# Patient Record
Sex: Female | Born: 2000 | Race: White | Hispanic: No | Marital: Single | Smoking: Never smoker
Health system: Southern US, Community
[De-identification: ages and names within clinical notes are randomized; demographics above are authoritative.]

## PROBLEM LIST (undated history)

## (undated) DIAGNOSIS — F319 Bipolar disorder, unspecified: Secondary | ICD-10-CM

## (undated) DIAGNOSIS — F410 Panic disorder [episodic paroxysmal anxiety] without agoraphobia: Secondary | ICD-10-CM

## (undated) DIAGNOSIS — F5000 Anorexia nervosa, unspecified: Secondary | ICD-10-CM

## (undated) DIAGNOSIS — K3184 Gastroparesis: Secondary | ICD-10-CM

---

## 2019-01-23 ENCOUNTER — Emergency Department (HOSPITAL_COMMUNITY)
Admission: EM | Admit: 2019-01-23 | Discharge: 2019-01-23 | Disposition: A | Discharge status: Home / Self Care | Payer: BLUE CROSS/BLUE SHIELD | Attending: Emergency Medicine | Admitting: Emergency Medicine

## 2019-01-23 ENCOUNTER — Encounter (HOSPITAL_COMMUNITY): Payer: Self-pay

## 2019-01-23 ENCOUNTER — Other Ambulatory Visit: Payer: Self-pay

## 2019-01-23 DIAGNOSIS — T7621XA Adult sexual abuse, suspected, initial encounter: Secondary | ICD-10-CM | POA: Insufficient documentation

## 2019-01-23 LAB — POC URINE PREG, ED: PREG TEST UR: NEGATIVE
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
## 2019-01-23 NOTE — Discharge Instructions (Addendum)
You may return to the ED tomorrow to have kit performed do know this will take about 4-6 hours. Please return to the ED sooner if other symptoms change.
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
## 2019-01-23 NOTE — ED Notes (Signed)
Made note on chart by accident, erased note.
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
## 2019-01-24 ENCOUNTER — Emergency Department (HOSPITAL_COMMUNITY)
Admission: EM | Admit: 2019-01-24 | Discharge: 2019-01-24 | Disposition: A | Discharge status: Home / Self Care | Payer: BLUE CROSS/BLUE SHIELD | Attending: Emergency Medicine | Admitting: Emergency Medicine

## 2019-01-24 ENCOUNTER — Other Ambulatory Visit: Payer: Self-pay

## 2019-01-24 ENCOUNTER — Encounter (HOSPITAL_COMMUNITY): Payer: Self-pay | Admitting: *Deleted

## 2019-01-24 DIAGNOSIS — Z0441 Encounter for examination and observation following alleged adult rape: Secondary | ICD-10-CM | POA: Diagnosis not present

## 2019-01-24 DIAGNOSIS — Z5321 Procedure and treatment not carried out due to patient leaving prior to being seen by health care provider: Secondary | ICD-10-CM | POA: Insufficient documentation

## 2019-01-24 HISTORY — DX: Bipolar disorder, unspecified: F31.9

## 2019-01-24 HISTORY — DX: Anorexia nervosa, unspecified: F50.00

## 2019-01-24 HISTORY — DX: Gastroparesis: K31.84

## 2019-01-24 HISTORY — DX: Panic disorder (episodic paroxysmal anxiety): F41.0

## 2019-01-24 NOTE — ED Notes (Signed)
Bed: WA23 Expected date:  Expected time:  Means of arrival:  Comments: 
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
## 2019-01-24 NOTE — ED Notes (Signed)
Pt not in [facility]

## 2020-03-24 ENCOUNTER — Emergency Department (HOSPITAL_COMMUNITY)
Admission: EM | Admit: 2020-03-24 | Discharge: 2020-03-25 | Disposition: A | Discharge status: Home / Self Care | Payer: BC Managed Care – PPO | Attending: Emergency Medicine | Admitting: Emergency Medicine

## 2020-03-24 ENCOUNTER — Encounter (HOSPITAL_COMMUNITY): Payer: Self-pay

## 2020-03-24 ENCOUNTER — Emergency Department (HOSPITAL_COMMUNITY): Payer: BC Managed Care – PPO

## 2020-03-24 ENCOUNTER — Other Ambulatory Visit: Payer: Self-pay

## 2020-03-24 DIAGNOSIS — R0789 Other chest pain: Secondary | ICD-10-CM | POA: Diagnosis not present

## 2020-03-24 DIAGNOSIS — F502 Bulimia nervosa: Secondary | ICD-10-CM | POA: Diagnosis not present

## 2020-03-24 DIAGNOSIS — R079 Chest pain, unspecified: Secondary | ICD-10-CM | POA: Diagnosis present

## 2020-03-24 LAB — CBC
HCT: 40.9 % (ref 36.0–46.0)
Hemoglobin: 13.1 g/dL (ref 12.0–15.0)
MCH: 27.7 pg (ref 26.0–34.0)
MCHC: 32 g/dL (ref 30.0–36.0)
MCV: 86.5 fL (ref 80.0–100.0)
Platelets: 347 10*3/uL (ref 150–400)
RBC: 4.73 MIL/uL (ref 3.87–5.11)
RDW: 12 % (ref 11.5–15.5)
WBC: 8.4 10*3/uL (ref 4.0–10.5)
nRBC: 0 % (ref 0.0–0.2)

## 2020-03-24 LAB — BASIC METABOLIC PANEL
Anion gap: 7 (ref 5–15)
BUN: 8 mg/dL (ref 6–20)
CO2: 27 mmol/L (ref 22–32)
Calcium: 9.4 mg/dL (ref 8.9–10.3)
Chloride: 104 mmol/L (ref 98–111)
Creatinine, Ser: 0.65 mg/dL (ref 0.44–1.00)
GFR calc Af Amer: >60 mL/min (ref >60)
GFR calc non Af Amer: >60 mL/min (ref >60)
Glucose, Bld: 95 mg/dL (ref 70–99)
Potassium: 3.8 mmol/L (ref 3.5–5.1)
Sodium: 138 mmol/L (ref 135–145)

## 2020-03-24 LAB — TROPONIN I (HIGH SENSITIVITY): Troponin I (High Sensitivity): <2 ng/L (ref <18)

## 2020-03-24 LAB — I-STAT BETA HCG BLOOD, ED (NOT ORDERABLE): I-stat hCG, quantitative: <5 m[IU]/mL (ref <5)

## 2020-03-24 MED ORDER — SODIUM CHLORIDE 0.9% FLUSH
3.0000 mL | Freq: Once | INTRAVENOUS | Status: AC
  Administered 2020-03-25: 01:00:00 3 mL via INTRAVENOUS

## 2020-03-24 NOTE — ED Triage Notes (Signed)
Pt sent from Urgent Care for further evaluation of non specific chest pain Pt states that she's had chest pain for a few weeks, intermittently, and she describes it as pressure Pt states that she's always nauseated because she has gastroparesis,  Pt denies any other symptoms

## 2020-03-25 MED ORDER — LIDOCAINE VISCOUS HCL 2 % MT SOLN: 15.0000 mL | Freq: Four times a day (QID) | OROMUCOSAL | 0 refills | Status: AC | PRN

## 2020-03-25 MED ORDER — SODIUM CHLORIDE 0.9 % IV BOLUS (SEPSIS)
1000.0000 mL | Freq: Once | INTRAVENOUS | Status: AC
  Administered 2020-03-25: 1000 mL via INTRAVENOUS

## 2020-03-25 MED ORDER — LIDOCAINE VISCOUS HCL 2 % MT SOLN
15.0000 mL | Freq: Once | OROMUCOSAL | Status: AC
  Administered 2020-03-25: 15 mL via ORAL
  Filled 2020-03-25: qty 15

## 2020-03-25 MED ORDER — PANTOPRAZOLE SODIUM 40 MG IV SOLR
40.0000 mg | Freq: Once | INTRAVENOUS | Status: AC
  Administered 2020-03-25: 40 mg via INTRAVENOUS
  Filled 2020-03-25: qty 40

## 2020-03-25 MED ORDER — ALUM & MAG HYDROXIDE-SIMETH 200-200-20 MG/5ML PO SUSP
30.0000 mL | Freq: Once | ORAL | Status: AC
  Administered 2020-03-25: 30 mL via ORAL
  Filled 2020-03-25: qty 30

## 2020-03-25 NOTE — ED Provider Notes (Signed)
TIME SEEN: 12:12 AM  CHIEF COMPLAINT: Chest pain, hematemesis, sent from urgent care  HPI: Patient is a 19 year old female with history of bipolar disorder, bulimia, gastroparesis followed by gastroenterology in [facility] who presents to the emergency department with 3 weeks of chest pain worse after vomiting.  Today she noticed small amounts of blood in her vomit and was seen at urgent care.  Urgent care sent her here for further evaluation.  She denies any abdominal pain, bloody stools or melena.  She is not on blood thinners.  She reports she does force vomiting several times a day.  She is on Dexilant.  No history of alcohol abuse or heavy NSAID use.  No history of PE, DVT, recent fractures, surgery, trauma, hospitalization or prolonged travel. No lower extremity swelling or pain. No calf tenderness.  She does have Nexplanon in place.  She is not a smoker.  ROS: See HPI Constitutional: no fever  Eyes: no drainage  ENT: no runny nose   Cardiovascular:   chest pain  Resp: no SOB  GI:  vomiting GU: no dysuria Integumentary: no rash  Allergy: no hives  Musculoskeletal: no leg swelling  Neurological: no slurred speech ROS otherwise negative  PAST MEDICAL HISTORY/PAST SURGICAL HISTORY:  Past Medical History:  Diagnosis Date  . Anorexia nervosa   . Bipolar disorder ([place])   . Gastroparesis   . Panic disorder     MEDICATIONS:  Prior to Admission medications   Not on File    ALLERGIES:  No Known Allergies  SOCIAL HISTORY:  Social History   Tobacco Use  . Smoking status: Never Smoker  . Smokeless tobacco: Never Used  Substance Use Topics  . Alcohol use: Yes    FAMILY HISTORY: History reviewed. No pertinent family history.  EXAM: BP 124/77 (BP Location: Left Arm)   Pulse (!) 107   Temp 98.1 F (36.7 C) (Oral)   Resp 18   Ht 5\' 2"  (1.575 m)   Wt 56.7 kg   SpO2 99%   BMI 22.86 kg/m  CONSTITUTIONAL: Alert and oriented and responds appropriately to  questions. Well-appearing; well-nourished, afebrile, nontoxic. HEAD: Normocephalic EYES: Conjunctivae clear, pupils appear equal, EOM appear intact ENT: normal nose; moist mucous membranes, no dental decay appreciated, normal phonation, no stridor, no trismus or drooling, handling secretions without difficulty NECK: Supple, normal ROM CARD: Regular and mildly tachycardic; S1 and S2 appreciated; no murmurs, no clicks, no rubs, no gallops RESP: Normal chest excursion without splinting or tachypnea; breath sounds clear and equal bilaterally; no wheezes, no rhonchi, no rales, no hypoxia or respiratory distress, speaking full sentences ABD/GI: Normal bowel sounds; non-distended; soft, non-tender, no rebound, no guarding, no peritoneal signs, no hepatosplenomegaly BACK:  The back appears normal EXT: Normal ROM in all joints; no deformity noted, no edema; no cyanosis, no calf tenderness or calf swelling SKIN: Normal color for age and race; warm; no rash on exposed skin NEURO: Moves all extremities equally PSYCH: The patient's mood and manner are appropriate.  Denies SI, HI, hallucinations.  Good eye contact.  MEDICAL DECISION MAKING: Patient here with atypical chest pain likely secondary to force vomiting from her bulimia.  She is very well-appearing here, nontoxic, handling her secretions.  I do not think that she has an esophageal tear or rupture at this time.  I do not think that she has esophageal varices.  I suspect that this is a Mallory-Weiss tear from frequent vomiting.  Her abdominal exam here is benign.  Plan  is to treat with Protonix, IV fluids and GI cocktail.  I do not feel she needs emergent imaging at this time.  I have reviewed her labs and interpreted them.  She has normal hemoglobin, normal creatinine and normal BUN.  Low suspicion for acute upper GI bleed.  She has had one normal high-sensitivity troponin.  Have very low suspicion this is ACS and I do not feel she needs a repeat.  Pregnancy  test is negative.  I have reviewed/interpreted her EKG.  Does show mild sinus tachycardia but no ischemia or arrhythmia.  She is on birth control but no other risk factors for PE.  Low suspicion this is a PE or dissection today.  Chest x-ray reviewed/interpreted and shows no acute abnormality.  No widened mediastinum, cardiomegaly, pneumothorax, infiltrate or edema.  ED PROGRESS: On reevaluation, patient reports feeling much better.  She has been able to drink without further vomiting.  I feel she is safe for discharge home.  She denies SI, HI or hallucinations.  I do not feel she needs emergent psychiatric evaluation.  She has an outpatient therapist for close follow-up as well as GI follow-up.  Have encouraged her to call in the morning for appointments.  Have recommended she continue her Dexilant.  Recommended over-the-counter Maalox or Mylanta for symptom relief and will discharge with viscous lidocaine as well.  Discussed at length return precautions.  Patient verbalized understanding.  She is aware that her symptoms are secondary to forceful vomiting from her bulimia.   At this time, I do not feel there is any life-threatening condition present. I have reviewed, interpreted and discussed all results (EKG, imaging, lab, urine as appropriate) and exam findings with patient/family. I have reviewed nursing notes and appropriate previous records.  I feel the patient is safe to be discharged home without further emergent workup and can continue workup as an outpatient as needed. Discussed usual and customary return precautions. Patient/family verbalize understanding and are comfortable with this plan.  Outpatient follow-up has been provided as needed. All questions have been answered.    EKG Interpretation  Date/Time:  Wednesday March 24 2020 20:30:17 EDT Ventricular Rate:  106 PR Interval:    QRS Duration: 99 QT Interval:  342 QTC Calculation: 455 R Axis:   86 Text Interpretation: Sinus tachycardia  RSR' in V1 or V2, right VCD or RVH 12 Lead; Mason-Likar No old tracing to compare Confirmed by Akeya Ryther, Baxter Hire [phone]) on 03/24/2020 11:58:53 PM          Melanie Morton was evaluated in Emergency Department on 03/25/2020 for the symptoms described in the history of present illness. She was evaluated in the context of the global COVID-19 pandemic, which necessitated consideration that the patient might be at risk for infection with the SARS-CoV-2 virus that causes COVID-19. Institutional protocols and algorithms that pertain to the evaluation of patients at risk for COVID-19 are in a state of rapid change based on information released by regulatory bodies including the CDC and federal and state organizations. These policies and algorithms were followed during the patient's care in the ED.      Kaseem Vastine, Layla Maw, DO 03/25/20 [phone]

## 2020-03-25 NOTE — ED Notes (Signed)
ED Provider at bedside. 

## 2020-03-25 NOTE — Discharge Instructions (Addendum)
Your labs today were reassuring.  I recommend close follow-up with your therapist as well as your gastroenterologist in [place].  Please continue your Dexilant as prescribed.  You may use over-the-counter Mylanta or Maalox to help with symptoms.  We are also sending you with lidocaine which can help coat your esophagus and stomach and help with your chest pain.  If you begin vomiting blood again and it is more than just streaks of blood, you have black or tarry stools, bright red blood in your stool, significant abdominal pain or worsening chest pain, unable to swallow, please return to the emergency department.  You may take over-the-counter Tylenol 1000 mg every 6 hours as needed for pain.  I would avoid NSAIDs such as ibuprofen, aspirin, Aleve, Motrin, naproxen, Goody powders at this time.

## 2020-03-25 NOTE — ED Notes (Addendum)
Verbalized understanding discharge instructions, prescriptions, and follow-up. In no acute distress.   

## 2020-03-25 NOTE — ED Notes (Signed)
Pt tolerated Ginger Ale well and ambulated to restroom.  NAD noted.

## 2020-03-25 NOTE — ED Notes (Signed)
Pt given Ginger Ale.  

## 2021-05-11 IMAGING — CR DG CHEST 2V
2 series · 2 of 2 positions shown · non-contrast
Comparison: None.

CLINICAL DATA: Nonspecific intermittent chest pain for several
weeks, nausea

EXAM:
CHEST - 2 VIEW

[w chest pa]
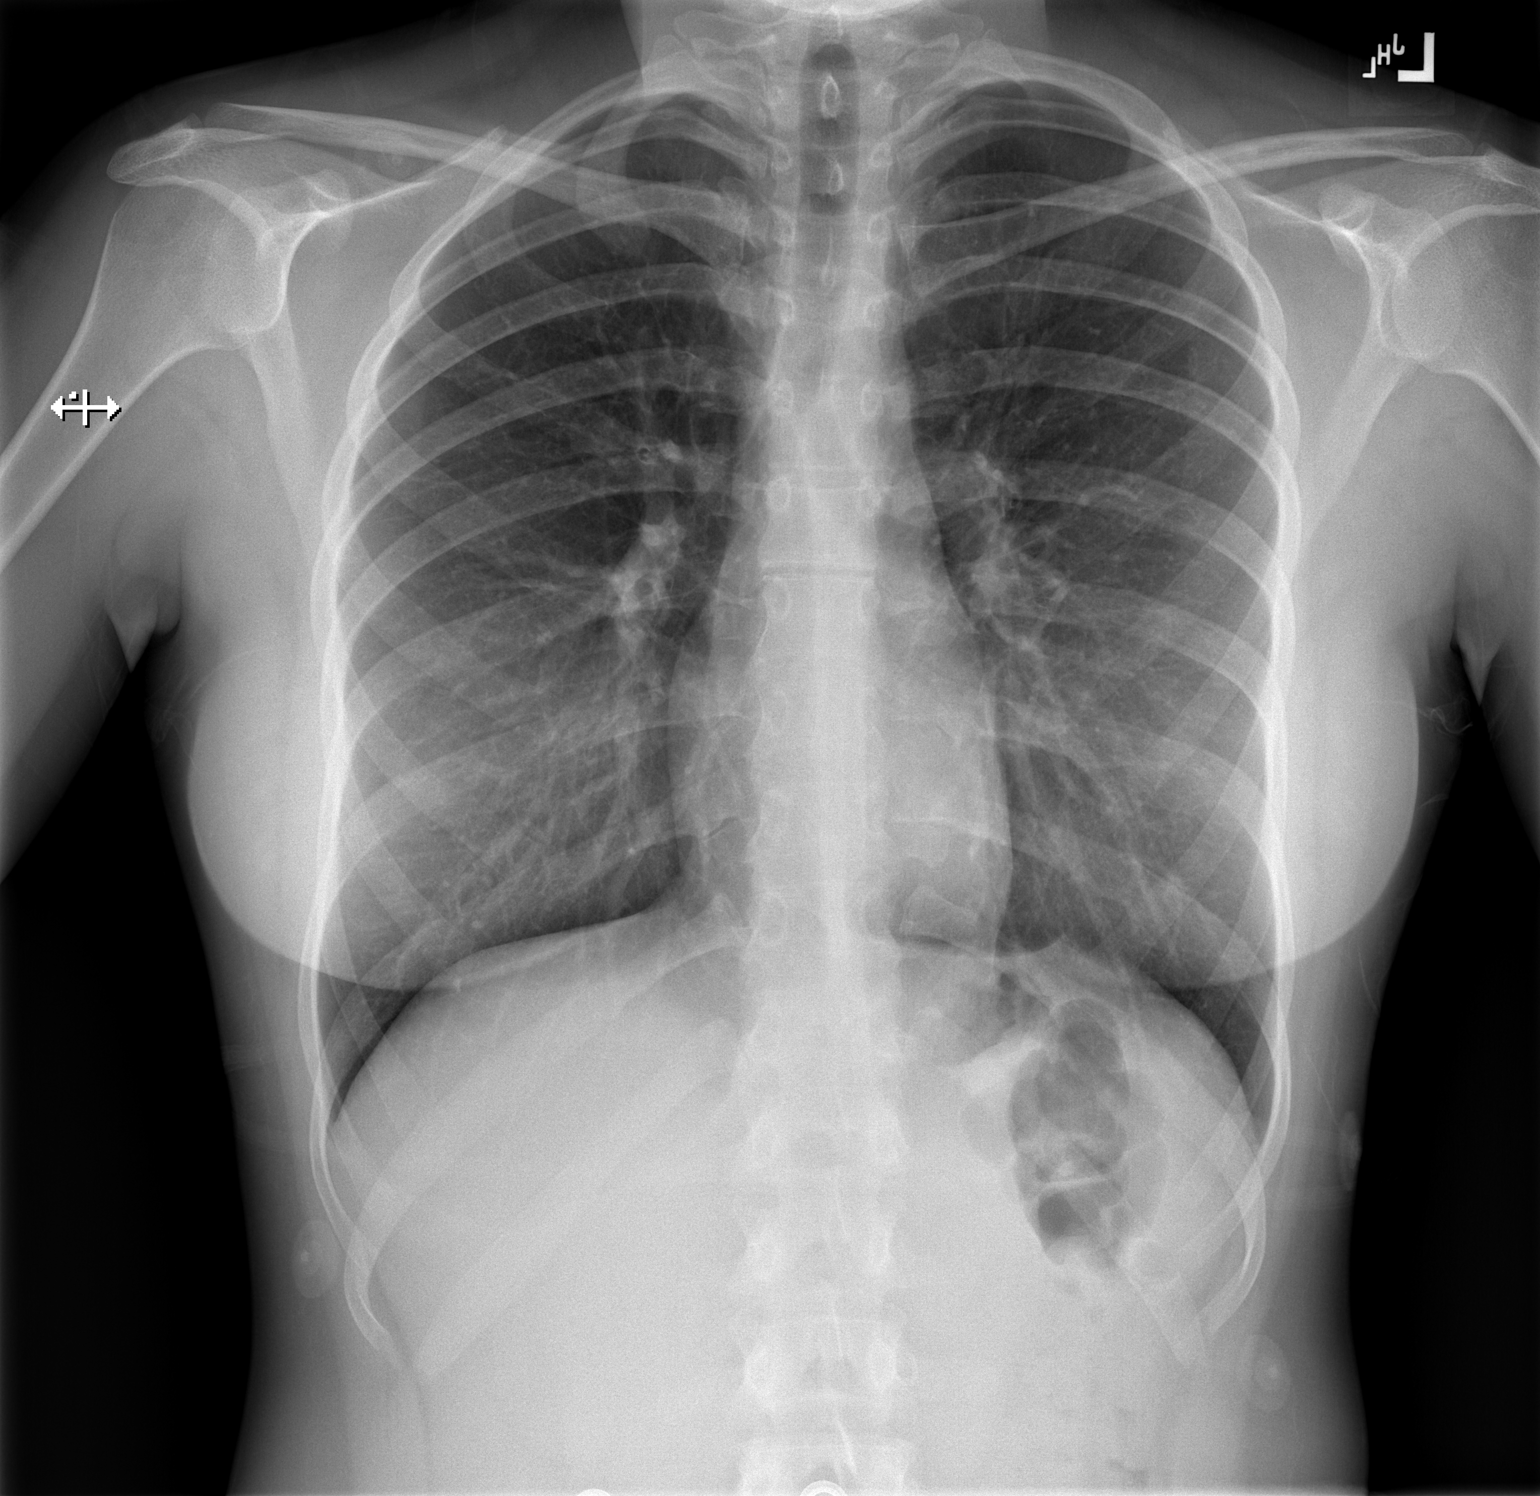

[w chest lat]
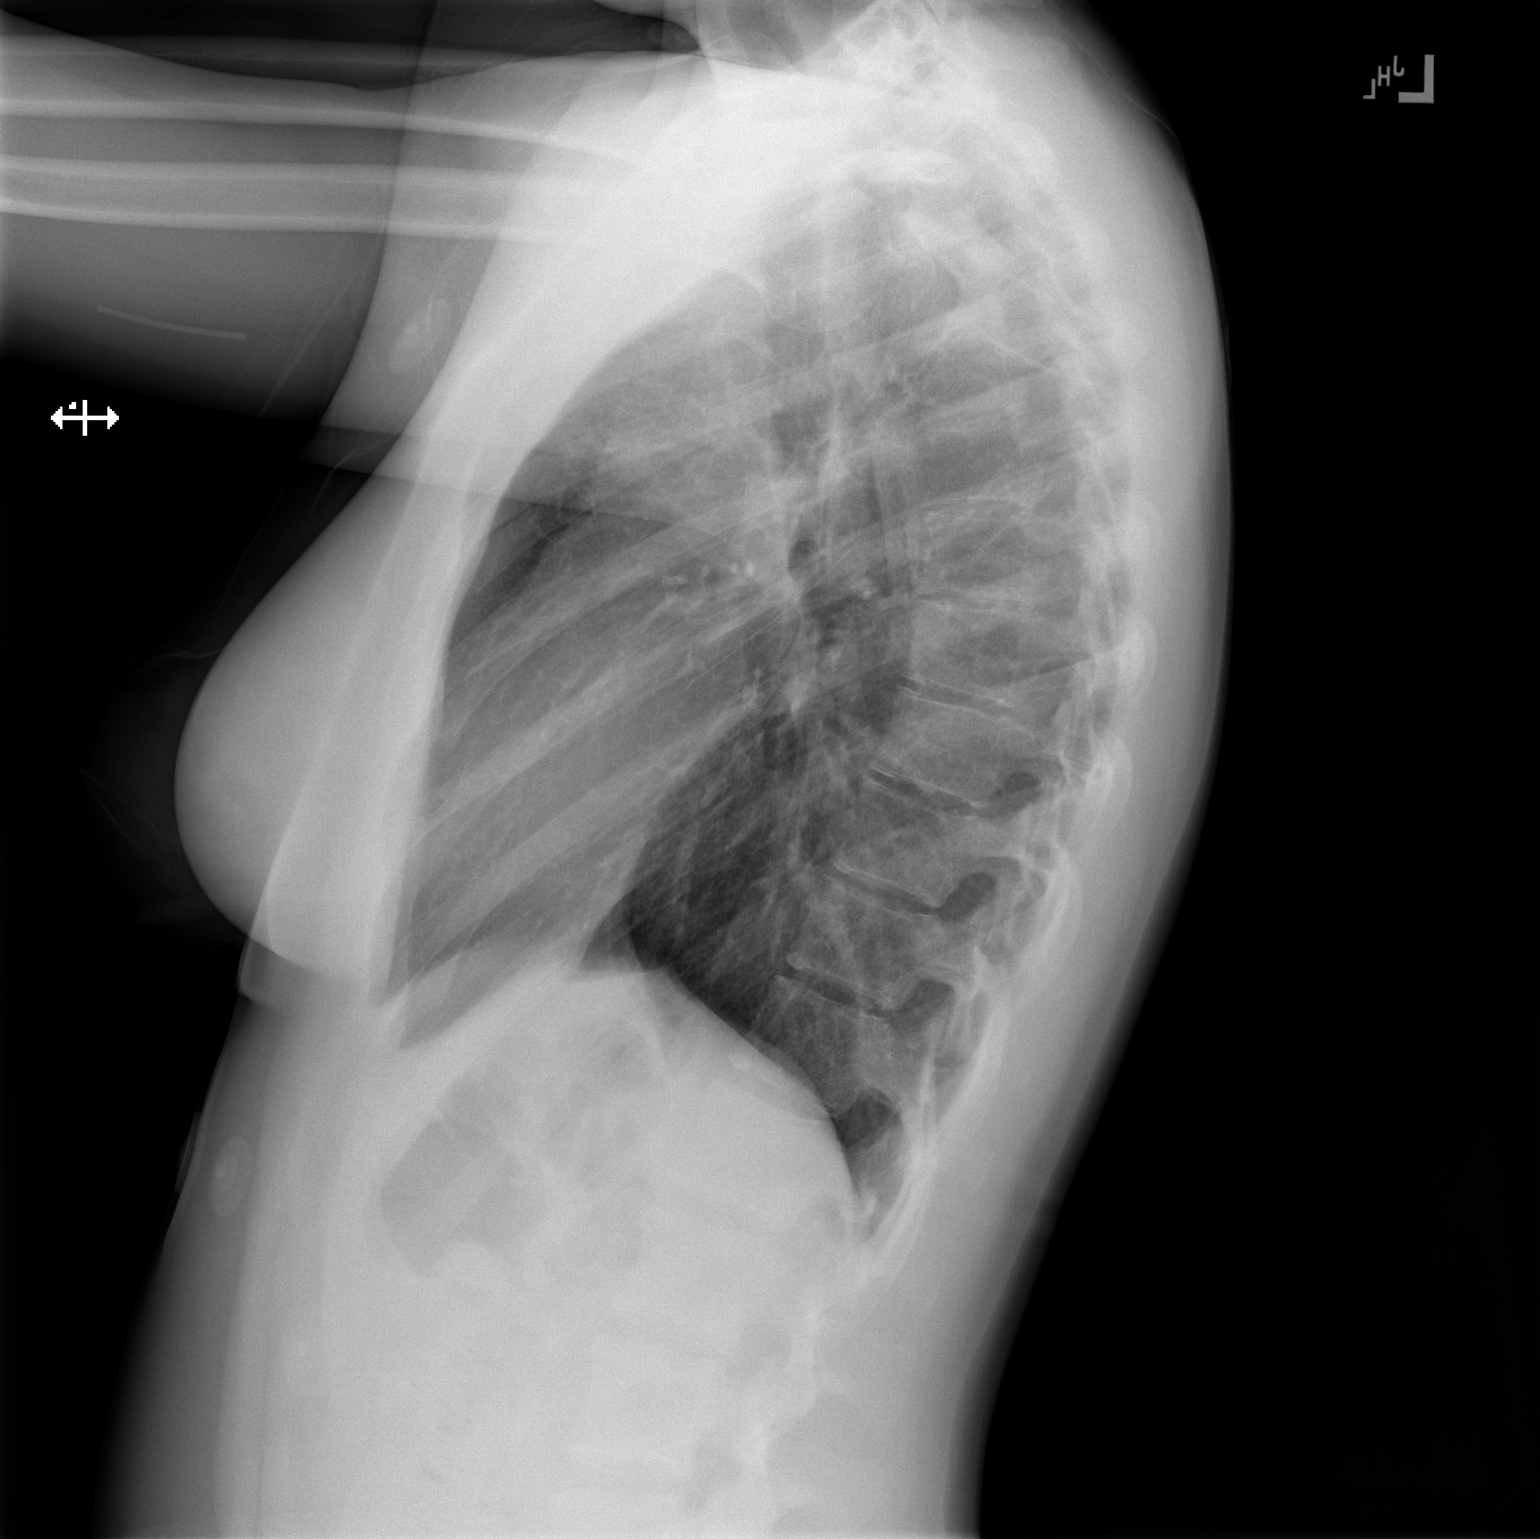

[2 of 2 positions shown; findings below may reference images not displayed]

FINDINGS: The heart size and mediastinal contours are within normal limits.
Both lungs are clear. The visualized skeletal structures are
unremarkable.
IMPRESSION: No active cardiopulmonary disease.
